# Patient Record
Sex: Male | Born: 1980 | Race: Black or African American | Hispanic: No | Marital: Single | Smoking: Current every day smoker
Health system: Southern US, Community
[De-identification: ages and names within clinical notes are randomized; demographics above are authoritative.]

---

## 1999-12-02 ENCOUNTER — Emergency Department (HOSPITAL_COMMUNITY): Admission: EM | Admit: 1999-12-02 | Discharge: 1999-12-02 | Payer: Self-pay | Admitting: Emergency Medicine

## 1999-12-03 ENCOUNTER — Emergency Department (HOSPITAL_COMMUNITY): Admission: EM | Admit: 1999-12-03 | Discharge: 1999-12-03 | Payer: Self-pay

## 2004-10-05 ENCOUNTER — Emergency Department (HOSPITAL_COMMUNITY): Admission: EM | Admit: 2004-10-05 | Discharge: 2004-10-05 | Payer: Self-pay | Admitting: Family Medicine

## 2006-06-21 ENCOUNTER — Other Ambulatory Visit: Payer: Self-pay

## 2006-06-21 ENCOUNTER — Emergency Department: Payer: Self-pay | Admitting: Emergency Medicine

## 2007-12-08 IMAGING — CR DG CHEST 2V
1 series · 2 of 2 positions shown · non-contrast
Comparison: none

REASON FOR EXAM: Syncope
COMMENTS:

PROCEDURE:     DXR - DXR CHEST PA (OR AP) AND LATERAL  - June 21, 2006  [DATE]
RESULT:     The lungs are clear.  The cardiac silhouette and visualized bony
skeleton are unremarkable.

[Series 1: view not recorded · 0.17mm/px · 2 of 2 slices shown]
[im 1/2]
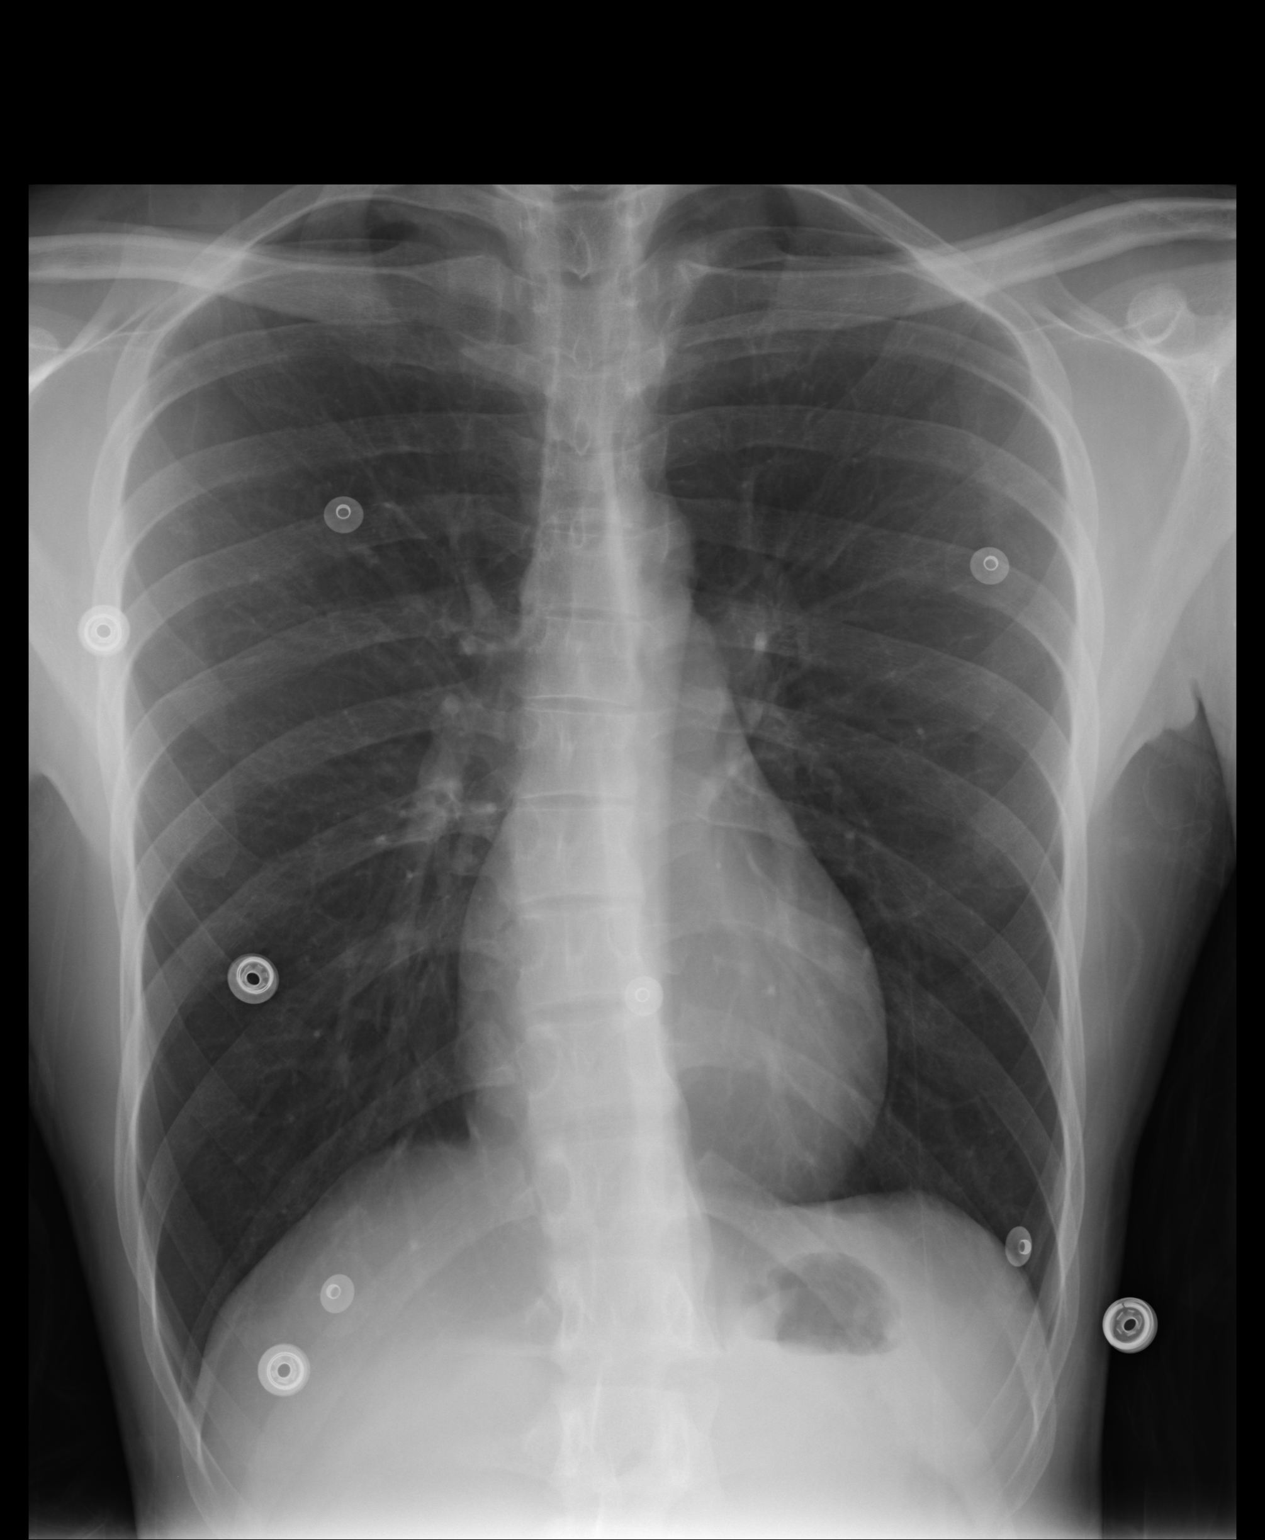
[im 2/2]
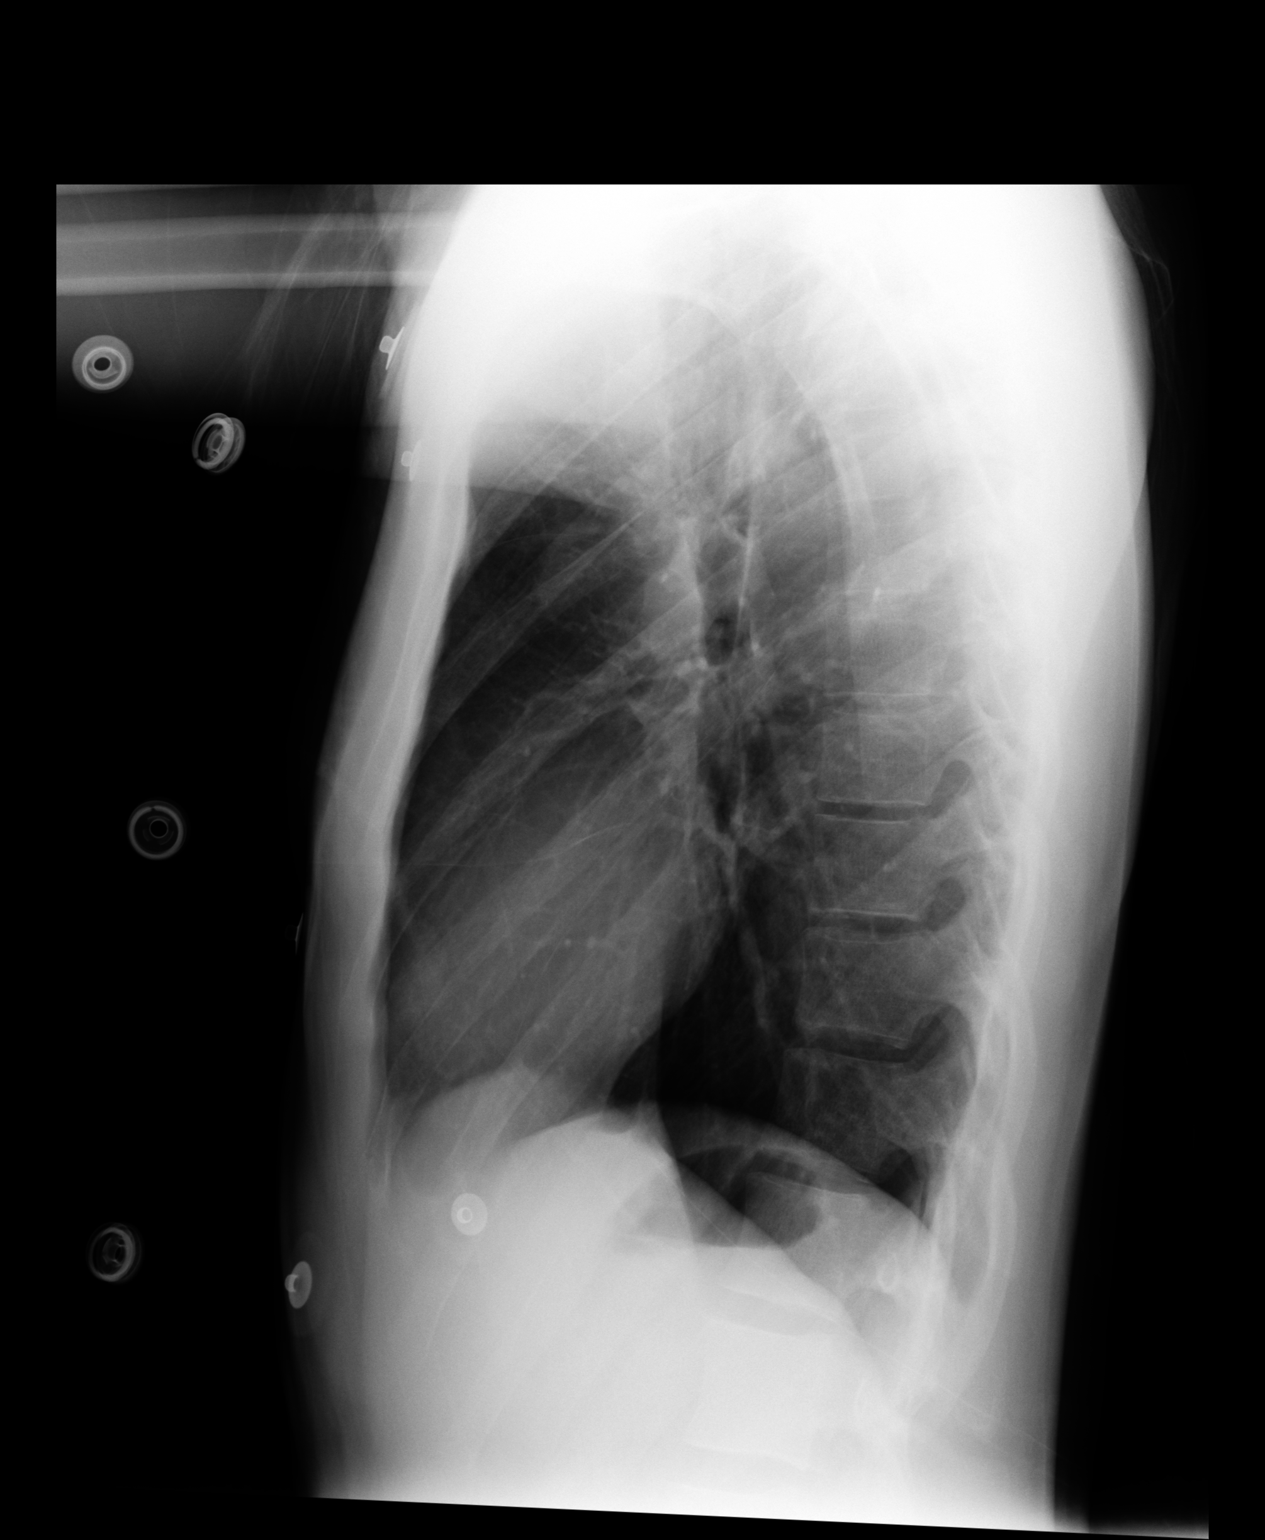

[2 of 2 positions shown; findings below may reference images not displayed]

IMPRESSION: Chest radiograph without evidence of acute cardiopulmonary disease.

## 2007-12-08 IMAGING — CT CT HEAD WITHOUT CONTRAST
2 series · 16 of 30 positions shown, 20 images · non-contrast
Comparison: none

REASON FOR EXAM: Syncope
COMMENTS:

[Series 2: without · axial · non-contrast · 0.40mm/px · z∈[+609,+734]mm · 13 of 31 slices shown, 17 images]
[im 3/31  brain]
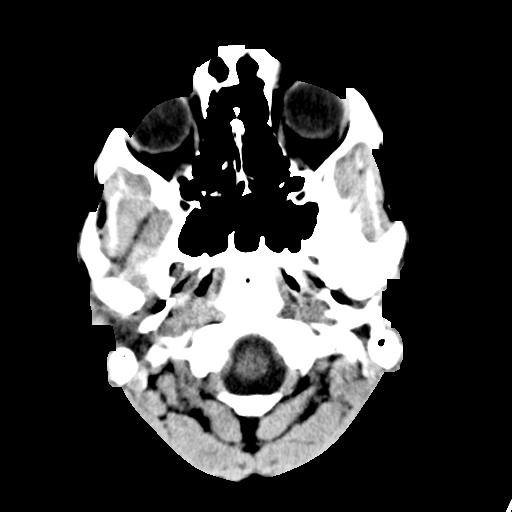
[im 3/31  bone]
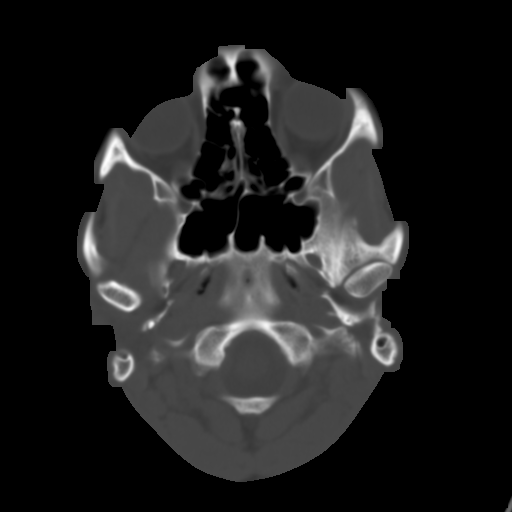
[im 5/31  brain]
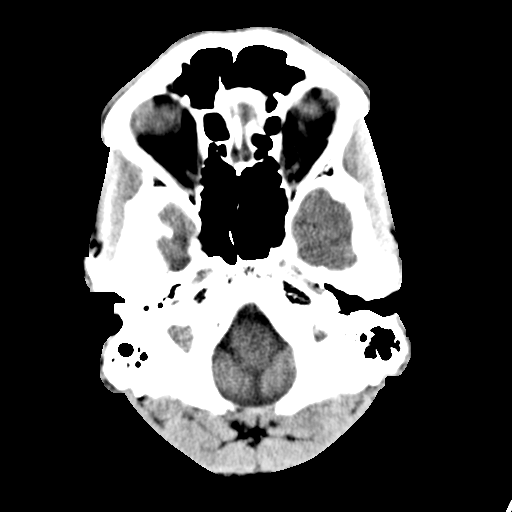
[im 7/31  brain]
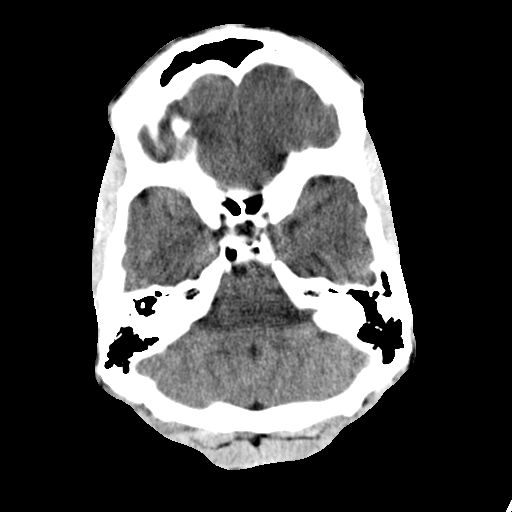
[im 9/31  brain]
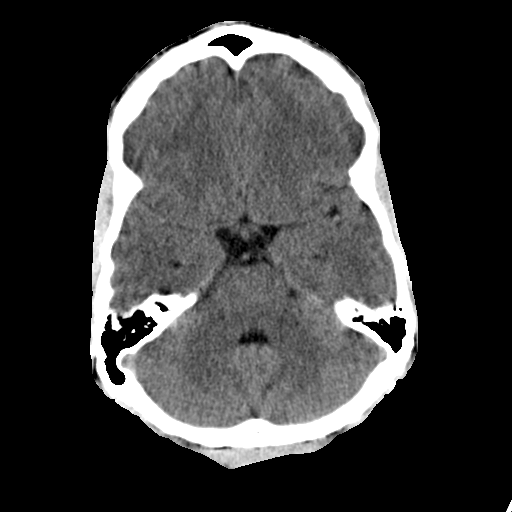
[im 11/31  brain]
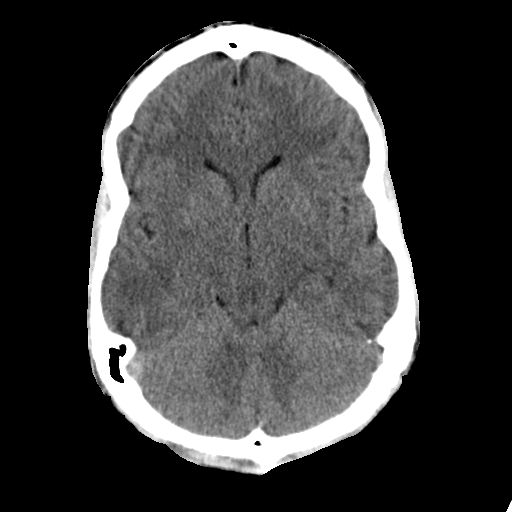
[im 11/31  bone]
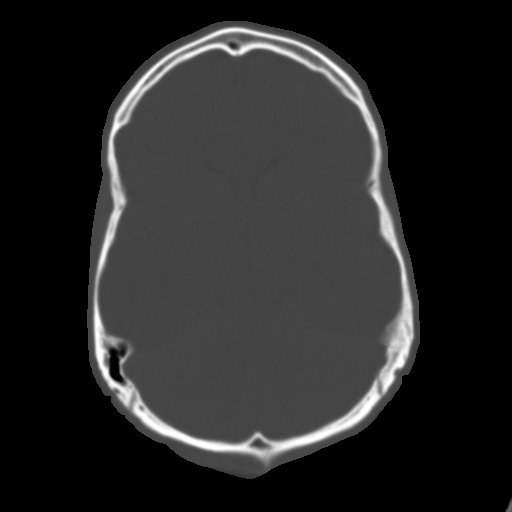
[im 13/31  brain]
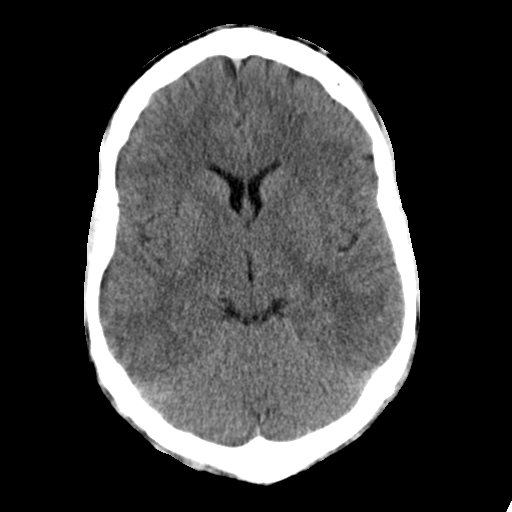
[im 16/31  brain]
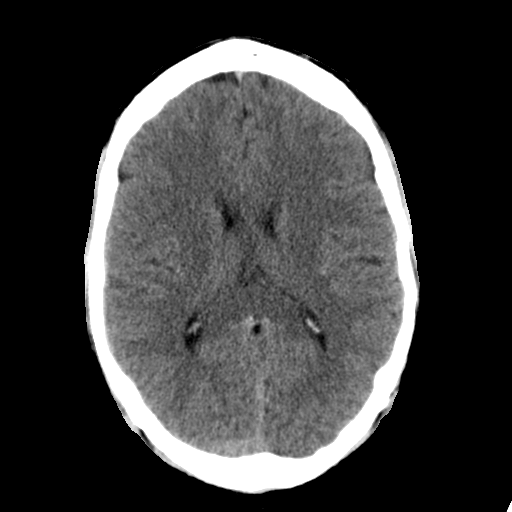
[im 18/31  brain]
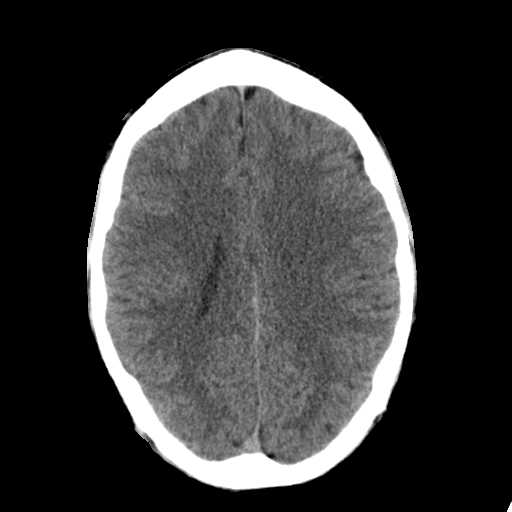
[im 20/31  brain]
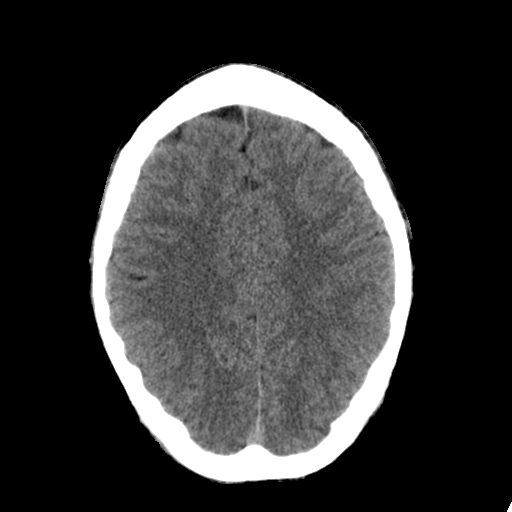
[im 20/31  bone]
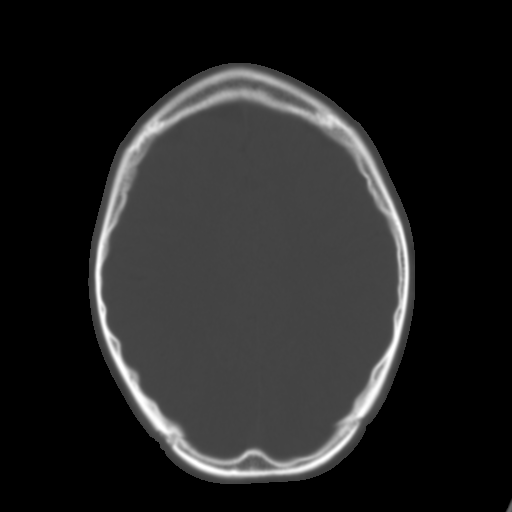
[im 22/31  brain]
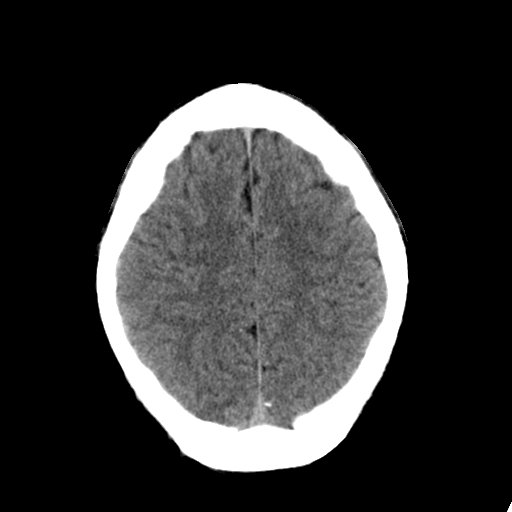
[im 24/31  brain]
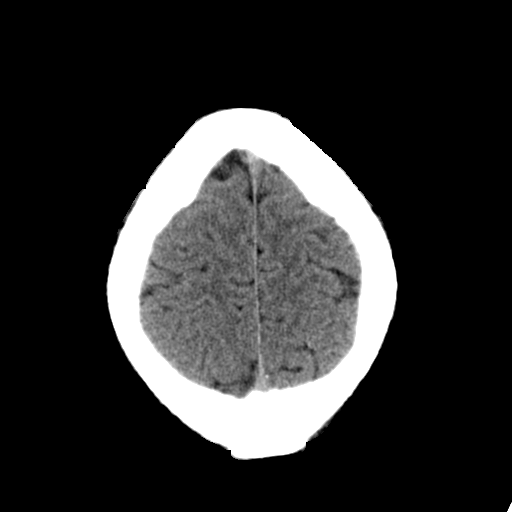
[im 26/31  brain]
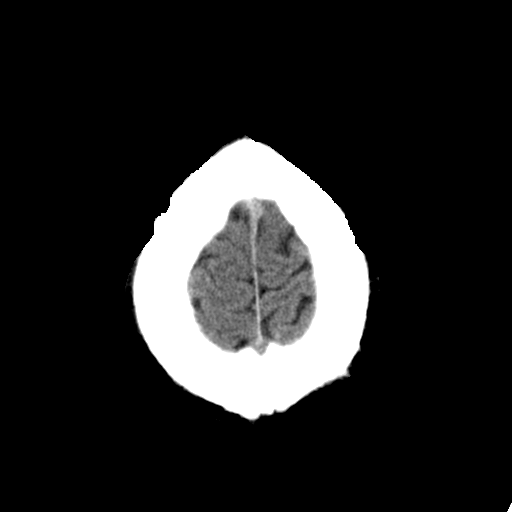
[im 28/31  brain]
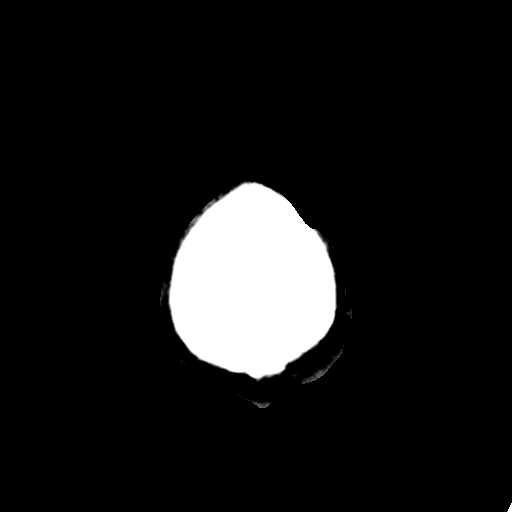
[im 28/31  bone]
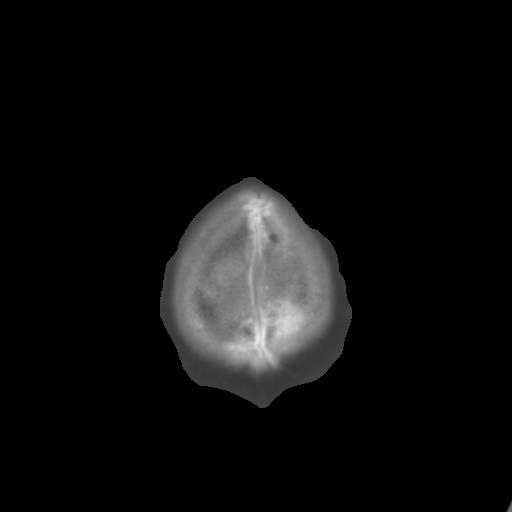

[Series 3: bone · axial · 0.40mm/px · z∈[+609,+649]mm · 3 of 31 slices shown]
[im 3/31  bone]
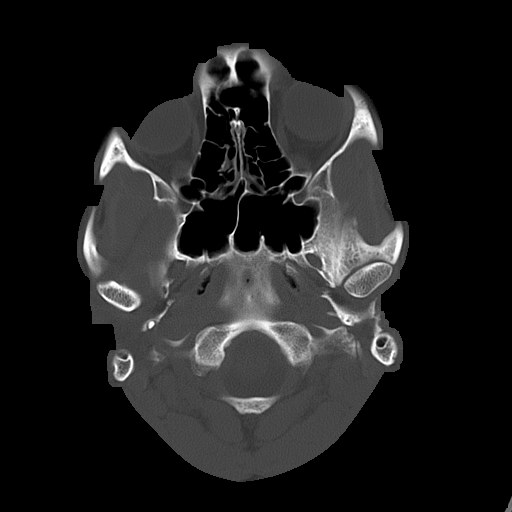
[im 7/31  bone]
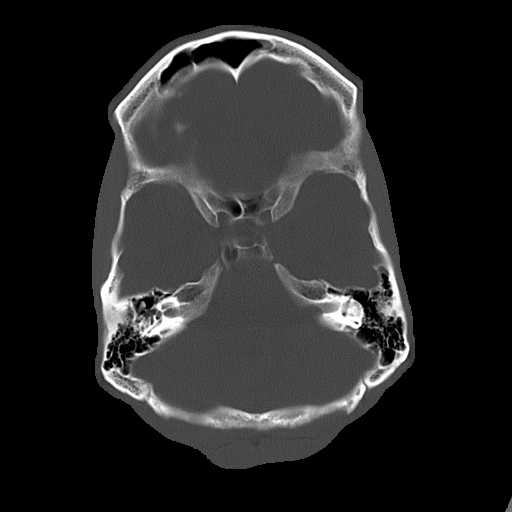
[im 11/31  bone]
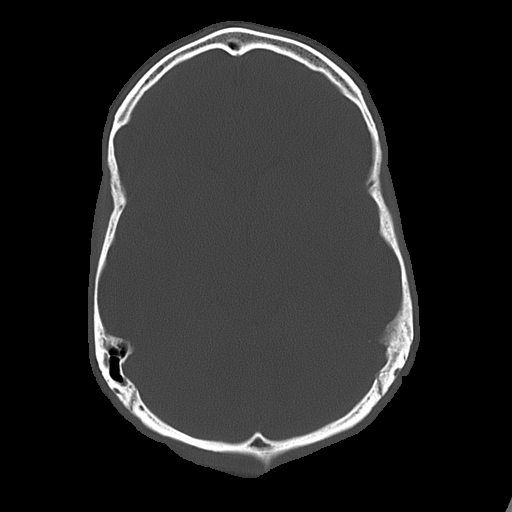

[16 of 30 positions shown; findings below may reference images not displayed]

PROCEDURE:     CT  - CT HEAD WITHOUT CONTRAST  - June 21, 2006  [DATE]

RESULT:     There is no evidence of intra-axial or extra-axial fluid
collections or evidence of acute hemorrhage. No secondary signs are
appreciated to suggest mass effect or subacute or chronic infarction.

The visualized bony skeleton evaluated with bone windowing demonstrates no
evidence of fracture or dislocation.
IMPRESSION: 1.     Unremarkable head CT as described above.
2.     Dr. Khalsa of the Emergency Department was informed of these
findings at the time of the initial interpretation.

## 2015-11-05 ENCOUNTER — Encounter (HOSPITAL_COMMUNITY): Payer: Self-pay

## 2015-11-05 ENCOUNTER — Emergency Department (HOSPITAL_COMMUNITY)
Admission: EM | Admit: 2015-11-05 | Discharge: 2015-11-05 | Disposition: A | Discharge status: Home / Self Care | Payer: Self-pay | Attending: Emergency Medicine | Admitting: Emergency Medicine

## 2015-11-05 DIAGNOSIS — K0889 Other specified disorders of teeth and supporting structures: Secondary | ICD-10-CM | POA: Insufficient documentation

## 2015-11-05 DIAGNOSIS — R6883 Chills (without fever): Secondary | ICD-10-CM | POA: Insufficient documentation

## 2015-11-05 DIAGNOSIS — R51 Headache: Secondary | ICD-10-CM | POA: Insufficient documentation

## 2015-11-05 DIAGNOSIS — K0381 Cracked tooth: Secondary | ICD-10-CM | POA: Insufficient documentation

## 2015-11-05 MED ORDER — IBUPROFEN 400 MG PO TABS
800.0000 mg | ORAL_TABLET | Freq: Once | ORAL | Status: AC
  Administered 2015-11-05: 800 mg via ORAL
  Filled 2015-11-05: qty 2

## 2015-11-05 MED ORDER — BUPIVACAINE-EPINEPHRINE (PF) 0.5% -1:200000 IJ SOLN
1.8000 mL | Freq: Once | INTRAMUSCULAR | Status: AC
  Administered 2015-11-05: 1.8 mL
  Filled 2015-11-05: qty 1.8

## 2015-11-05 MED ORDER — PENICILLIN V POTASSIUM 500 MG PO TABS: 500.0000 mg | ORAL_TABLET | Freq: Four times a day (QID) | ORAL | Status: AC

## 2015-11-05 MED ORDER — PENICILLIN V POTASSIUM 250 MG PO TABS
500.0000 mg | ORAL_TABLET | Freq: Once | ORAL | Status: AC
  Administered 2015-11-05: 500 mg via ORAL
  Filled 2015-11-05: qty 2

## 2015-11-05 NOTE — Discharge Instructions (Signed)
Mr. Garrett Olson,  Nice meeting you! Please follow-up with your dentist. Return to the emergency department if you develop fevers, chills, increased pain, difficulty opening/closing your jaw, difficulty swallowing. Feel better soon!  S. Garrett HackerNicole Caylyn Tedeschi, PA-C  [employer] [facility] The [employer] 211 is a great source of information about community services available.  Access by dialing 2-1-1 from anywhere in [place], or by website -  PooledIncome.plwww.nc211.org.   Other Local Resources (Updated 06/2015)  Dental  Care   Services    Phone Number and Address  Cost  [facility] For children 850 - 37 years of age:   Cleaning  Tooth brushing/flossing instruction  Sealants, fillings, crowns  Extractions  Emergency treatment  [phone] [address] [place], [place] [address] Charges based on family income.  Medicaid and some insurance plans accepted.     [facility]  Cleaning  Sealants, fillings, crowns  Extractions  Emergency treatment [phone] [address] [place], [place]  Pregnant women 37 years of age or older with a Medicaid card  [facility] [place]  Cleaning  Sealants, fillings, crowns  Extractions  Emergency treatment [phone] [address] [place], [place] Pregnant women 37 years of age or older with a Medicaid card  [facility] For children 360 - 37 years of age:   Cleaning  Tooth brushing/flossing instruction  Sealants, fillings, crowns  Extractions  Emergency treatment Limited orthodontic services for patients with Medicaid [phone] [address] [place], [place] [address] Medicaid and [facility] Health Choice cover for children up to age 37 and pregnant women.  Parents of children up to age 37 without Medicaid pay a reduced fee at time of service.  [facility] [place] For children 200 - 37 years of age:   Cleaning  Tooth brushing/flossing instruction  Sealants, fillings, crowns  Extractions  Emergency treatment Limited orthodontic services for patients with Medicaid [phone] [address] [place], [place].  Medicaid and [place] Health Choice cover for children up to age 37 and pregnant women.  Parents of children up to age 37 without Medicaid pay a reduced fee.  [facility]  Cleaning  Sealants, fillings, crowns  Extractions  Hours: Tuesdays and Thursdays, 4:15 - 8 pm [phone] [address] [place], [place] [address] Services free of charge to [facility] residents ages 18-64 who do not have health insurance, Medicare, [place], or [place] benefits and fall within federal poverty guidelines  [facility]    Provides dental care in addition to primary medical care, nutritional counseling, and pharmacy:  Nurse, mental healthCleaning  Sealants, fillings, crowns  Extractions                  [phone] [facility], [address] [place], [place]  [phone] [facility], [address] [place], [place]  [phone] [facility] [place], [place]  [phone] [facility], [address] [place], [place]  [phone] [facility] [address] [place], [place] Accepts [place], [place], most insurance.  Also provides services available to all with fees adjusted based on ability to pay.    [facility]  Cleaning  Tooth brushing/flossing instruction  Sealants, fillings, crowns  Extractions  Emergency treatment  Hours: Tuesdays, Thursdays, and Fridays from 8 am to 5 pm by appointment only. [phone] [address] [place], [place] [address] [facility] residents with  Medicaid (depending on eligibility) and children with [facility] Health Choice - call for more information.  [facility]  Extractions only  Hours: 2nd and 4th Thursday of each month from 6:30 am - 9 am.   [phone] [address] [place], [place] [address] Ages 67 and older only.  Patients are seen on a first come, first served basis.  [employer]  [employer]  Extractions  Orthodontics  Endodontics  Implants/Crowns/Bridges  Complete and partial dentures [phone] [place], [place] Patients must complete an application for services.  There is often a waiting list.

## 2015-11-05 NOTE — ED Provider Notes (Signed)
CSN: [id number]     Arrival date & time 11/05/15  1717 History  By signing my name below, I, Garrett Olson, attest that this documentation has been prepared under the direction and in the presence of Melton Krebs, PA-C. Electronically Signed: [name], ED Scribe. 11/05/2015. 5:26 PM.    Chief Complaint  Patient presents with  . Dental Pain   Patient is a 35 y.o. male presenting with tooth pain. The history is provided by the patient. No language interpreter was used.  Dental Pain Associated symptoms: headaches   Associated symptoms: no fever    HPI Comments: Garrett Olson is a 35 y.o. male who presents to the Emergency Department complaining of left sided dental pain onset 1 week prior. Pt states that he has associated chills and HA. Pt states that he feels as if he has a wisdom tooth growing in. Pt states that he has been taking tylenol with no relief. Pt denies fever, nausea, vomiting, or trouble swallowing.   No past medical history on file. No past surgical history on file. No family history on file. Social History  Substance Use Topics  . Smoking status: Not on file  . Smokeless tobacco: Not on file  . Alcohol Use: Not on file    Review of Systems  Constitutional: Positive for chills. Negative for fever.  HENT: Positive for dental problem. Negative for trouble swallowing.   Gastrointestinal: Negative for nausea and vomiting.  Neurological: Positive for headaches.  A complete 10 system review of systems was obtained and all systems are negative except as noted in the HPI and PMH.     Allergies  Review of patient's allergies indicates not on file.  Home Medications   Prior to Admission medications   Not on File   BP 131/74 mmHg  Pulse 73  Temp(Src) 98.7 F (37.1 C) (Oral)  SpO2 99%   Physical Exam  Constitutional: He is oriented to person, place, and time. He appears well-developed and well-nourished. No distress.  HENT:  Head: Normocephalic  and atraumatic.  Mouth/Throat:    Broken posterior left molar no erythema or fluctuance. No trismus.  Eyes: Conjunctivae and EOM are normal.  Neck: Neck supple. No tracheal deviation present.  Cardiovascular: Normal rate.   Pulmonary/Chest: Effort normal. No respiratory distress.  Musculoskeletal: Normal range of motion.  Neurological: He is alert and oriented to person, place, and time.  Skin: Skin is warm and dry.  Psychiatric: He has a normal mood and affect. His behavior is normal.  Nursing note and vitals reviewed.   ED Course  .Nerve Block Date/Time: 11/05/2015 5:39 PM Performed by: Althea Grimmer NICOLE Authorized by: Melton Krebs Consent: Verbal consent obtained. Risks and benefits: risks, benefits and alternatives were discussed Consent given by: patient Patient identity confirmed: verbally with patient and arm band Indications: pain relief Body area: face/mouth Nerve: inferior alveolar Local anesthetic: bupivacaine 0.5% with epinephrine Outcome: pain improved Patient tolerance: Patient tolerated the procedure well with no immediate complications    DIAGNOSTIC STUDIES: Oxygen Saturation is 99% on RA, normal by my interpretation.    COORDINATION OF CARE: 5:27 PM-Discussed treatment plan with pt at bedside and pt agreed to plan.    MDM   Final diagnoses:  Dentalgia   Patient with toothache.  No gross abscess.  Exam unconcerning for Ludwig's angina or spread of infection.  Will treat with penicillin and ibuprofen.  Urged patient to follow-up with dentist.    Patient may be safely discharged home.  Discussed reasons for return. Patient to follow-up with dentist. Provided resources. Patient in understanding and agreement with the plan.   Melton KrebsSamantha Nicole Ramsey Midgett, PA-C 11/05/15 1740  Raeford RazorStephen Kohut, MD 11/14/15 [phone]

## 2015-11-05 NOTE — ED Notes (Signed)
Pt stable, ambulatory, states understanding of discharge instructions 

## 2015-11-05 NOTE — ED Notes (Signed)
Pt c/o lower left dental pain

## 2015-11-11 ENCOUNTER — Emergency Department (HOSPITAL_COMMUNITY)
Admission: EM | Admit: 2015-11-11 | Discharge: 2015-11-11 | Disposition: A | Discharge status: Home / Self Care | Payer: Self-pay | Attending: Emergency Medicine | Admitting: Emergency Medicine

## 2015-11-11 ENCOUNTER — Encounter (HOSPITAL_COMMUNITY): Payer: Self-pay

## 2015-11-11 ENCOUNTER — Encounter (HOSPITAL_COMMUNITY): Payer: Self-pay | Admitting: *Deleted

## 2015-11-11 DIAGNOSIS — K611 Rectal abscess: Secondary | ICD-10-CM | POA: Insufficient documentation

## 2015-11-11 DIAGNOSIS — L0231 Cutaneous abscess of buttock: Secondary | ICD-10-CM | POA: Insufficient documentation

## 2015-11-11 MED ORDER — AMOXICILLIN-POT CLAVULANATE 875-125 MG PO TABS
1.0000 | ORAL_TABLET | Freq: Once | ORAL | Status: AC
  Administered 2015-11-11: 1 via ORAL
  Filled 2015-11-11: qty 1

## 2015-11-11 MED ORDER — FENTANYL CITRATE (PF) 100 MCG/2ML IJ SOLN
50.0000 ug | Freq: Once | INTRAMUSCULAR | Status: AC
  Administered 2015-11-11: 50 ug via INTRAVENOUS
  Filled 2015-11-11: qty 2

## 2015-11-11 MED ORDER — OXYCODONE-ACETAMINOPHEN 5-325 MG PO TABS
2.0000 | ORAL_TABLET | Freq: Once | ORAL | Status: AC
  Administered 2015-11-11: 2 via ORAL
  Filled 2015-11-11: qty 2

## 2015-11-11 MED ORDER — LIDOCAINE-EPINEPHRINE (PF) 2 %-1:200000 IJ SOLN
20.0000 mL | Freq: Once | INTRAMUSCULAR | Status: AC
  Administered 2015-11-11: 20 mL via INTRADERMAL
  Filled 2015-11-11: qty 20

## 2015-11-11 MED ORDER — AMOXICILLIN-POT CLAVULANATE 875-125 MG PO TABS: 1.0000 | ORAL_TABLET | Freq: Two times a day (BID) | ORAL | Status: AC

## 2015-11-11 MED ORDER — OXYCODONE-ACETAMINOPHEN 5-325 MG PO TABS: 1.0000 | ORAL_TABLET | Freq: Four times a day (QID) | ORAL | Status: DC | PRN

## 2015-11-11 NOTE — Discharge Instructions (Signed)
Take antibiotics as prescribed.  Please return to the ER in 2 days for wound recheck  Perirectal Abscess An abscess is an infected area that contains a collection of pus. A perirectal abscess is an abscess that is near the opening of the anus or around the rectum. A perirectal abscess can cause a lot of pain, especially during bowel movements. CAUSES This condition is almost always caused by an infection that starts in an anal gland. RISK FACTORS This condition is more likely to develop in:  People with diabetes or inflammatory bowel disease.  People whose body defense system (immune system) is weak.  People who have anal sex.  People who have a sexually transmitted disease (STD).  People who have certain kinds of cancers, such as rectal carcinoma, leukemia, or lymphoma. SYMPTOMS The main symptom of this condition is pain. The pain may be a throbbing pain that gets worse during bowel movements. Other symptoms include:  Fever.  Swelling.  Redness.  Bleeding.  Constipation. DIAGNOSIS The condition is diagnosed with a physical exam. If the abscess is not visible, a health care provider may need to place a finger inside the rectum to find the abscess. Sometimes, imaging tests are done to determine the size and location of the abscess. These tests may include:  An ultrasound.  An MRI.  A CT scan. TREATMENT This condition is usually treated with incision and drainage surgery. Incision and drainage surgery involves making an incision over the abscess to drain the pus. Treatment may also involve antibiotic medicine, pain medicine, stool softeners, or laxatives. HOME CARE INSTRUCTIONS  Take medicines only as directed by your health care provider.  If you were prescribed an antibiotic, finish all of it even if you start to feel better.  To relieve pain, try sitting:  In a warm, shallow bath (sitz bath).  On a heating pad with the setting on low.  On an inflatable  donut-shaped cushion.  Follow any diet instructions as directed by your health care provider.  Keep all follow-up visits as directed by your health care provider. This is important. SEEK MEDICAL CARE IF:  Your abscess is bleeding.  You have pain, swelling, or redness that is getting worse.  You are constipated.  You feel ill.  You have muscle aches or chills.  You have a fever.  Your symptoms return after the abscess has healed.   This information is not intended to replace advice given to you by your health care provider. Make sure you discuss any questions you have with your health care provider.   Document Released: 06/04/2000 Document Revised: 02/26/2015 Document Reviewed: 04/17/2014 Elsevier Interactive Patient Education 2016 Elsevier Inc. Incision and Drainage Incision and drainage is a procedure in which a sac-like structure (cystic structure) is opened and drained. The area to be drained usually contains material such as pus, fluid, or blood.  LET YOUR CAREGIVER KNOW ABOUT:   Allergies to medicine.  Medicines taken, including vitamins, herbs, eyedrops, over-the-counter medicines, and creams.  Use of steroids (by mouth or creams).  Previous problems with anesthetics or numbing medicines.  History of bleeding problems or blood clots.  Previous surgery.  Other health problems, including diabetes and kidney problems.  Possibility of pregnancy, if this applies. RISKS AND COMPLICATIONS  Pain.  Bleeding.  Scarring.  Infection. BEFORE THE PROCEDURE  You may need to have an ultrasound or other imaging tests to see how large or deep your cystic structure is. Blood tests may also be used to determine  if you have an infection or how severe the infection is. You may need to have a tetanus shot. PROCEDURE  The affected area is cleaned with a cleaning fluid. The cyst area will then be numbed with a medicine (local anesthetic). A small incision will be made in the  cystic structure. A syringe or catheter may be used to drain the contents of the cystic structure, or the contents may be squeezed out. The area will then be flushed with a cleansing solution. After cleansing the area, it is often gently packed with a gauze or another wound dressing. Once it is packed, it will be covered with gauze and tape or some other type of wound dressing. AFTER THE PROCEDURE   Often, you will be allowed to go home right after the procedure.  You may be given antibiotic medicine to prevent or heal an infection.  If the area was packed with gauze or some other wound dressing, you will likely need to come back in 1 to 2 days to get it removed.  The area should heal in about 14 days.   This information is not intended to replace advice given to you by your health care provider. Make sure you discuss any questions you have with your health care provider.   Document Released: 12/01/2000 Document Revised: 12/07/2011 Document Reviewed: 08/02/2011 Elsevier Interactive Patient Education 2016 Elsevier Inc.  Incision and Drainage, Care After Refer to this sheet in the next few weeks. These instructions provide you with information on caring for yourself after your procedure. Your caregiver may also give you more specific instructions. Your treatment has been planned according to current medical practices, but problems sometimes occur. Call your caregiver if you have any problems or questions after your procedure. HOME CARE INSTRUCTIONS   If antibiotic medicine is given, take it as directed. Finish it even if you start to feel better.  Only take over-the-counter or prescription medicines for pain, discomfort, or fever as directed by your caregiver.  Keep all follow-up appointments as directed by your caregiver.  Change any bandages (dressings) as directed by your caregiver. Replace old dressings with clean dressings.  Wash your hands before and after caring for your  wound. You will receive specific instructions for cleansing and caring for your wound.  SEEK MEDICAL CARE IF:   You have increased pain, swelling, or redness around the wound.  You have increased drainage, smell, or bleeding from the wound.  You have muscle aches, chills, or you feel generally sick.  You have a fever. MAKE SURE YOU:   Understand these instructions.  Will watch your condition.  Will get help right away if you are not doing well or get worse.   This information is not intended to replace advice given to you by your health care provider. Make sure you discuss any questions you have with your health care provider.   Document Released: 08/30/2011 Document Revised: 06/28/2014 Document Reviewed: 08/30/2011 Elsevier Interactive Patient Education [employer]

## 2015-11-11 NOTE — ED Notes (Signed)
Pt is in stable condition upon d/c and ambulates from ED. 

## 2015-11-11 NOTE — ED Notes (Signed)
Patient here with abscess to buttocks x 5 days. Reports draining x 3. No hx of same

## 2015-11-11 NOTE — ED Notes (Signed)
The pt is c/o an abscess on hisbuttocks for one week  No previous history

## 2015-11-11 NOTE — ED Notes (Signed)
I&D tray set up at bedside.

## 2015-11-11 NOTE — ED Provider Notes (Addendum)
CSN: [id number]     Arrival date & time 11/11/15  0902 History  By signing my name below, I, Ronney Lion, attest that this documentation has been prepared under the direction and in the presence of Danelle Berry, PA-C. Electronically Signed: Ronney Lion, ED Scribe. 11/11/2015. 2:40 PM.    Chief Complaint  Patient presents with  . Abscess   The history is provided by the patient and the spouse. No language interpreter was used.   HPI Comments: Garrett Olson is a 35 y.o. male who presents to the Emergency Department complaining of a new, gradual-onset, constant, gradually worsening, localized area of pain, redness, and swelling on his left buttock that began 5 days ago. His wife states the area had come to a head 2 days after onset and began drained spontaneously. She reports she had applied warm compresses to the area for the past 5 days, it continue to have purulent drainage but the size and pain have not improved.  Currently pain is rated 5/10 without radiation, worsened with attempting to sit, or with palpation, no alleviating factors. He denies fever, chills, sweats, N, V, abdominal pain.  No difficulty with BM.  Patient reports a history of asthma but otherwise denies any other chronic medical conditions. He denies a history of DM.  Patient has NKDA to antibiotics.   History reviewed. No pertinent past medical history. History reviewed. No pertinent past surgical history. No family history on file. Social History  Substance Use Topics  . Smoking status: Never Smoker   . Smokeless tobacco: None  . Alcohol Use: Yes    Review of Systems  Constitutional: Negative for fever.  Gastrointestinal: Negative.  Negative for nausea, vomiting and abdominal pain.  Genitourinary: Negative.   Skin: Positive for color change.       Positive for localized area of pain, redness, and swelling.  All other systems reviewed and are negative.   Allergies  Review of patient's allergies indicates no known  allergies.  Home Medications   Prior to Admission medications   Medication Sig Start Date End Date Taking? Authorizing Provider  penicillin v potassium (VEETID) 500 MG tablet Take 1 tablet (500 mg total) by mouth 4 (four) times daily. 11/05/15 11/12/15  Melton Krebs, PA-C   BP 125/84 mmHg  Pulse 83  Temp(Src) 98.1 F (36.7 C) (Oral)  Resp 18  SpO2 99% Physical Exam  Constitutional: He is oriented to person, place, and time. He appears well-developed and well-nourished. No distress.  Non-toxic appearing male, thin, appears extremely uncomfortable  HENT:  Head: Normocephalic and atraumatic.  Right Ear: External ear normal.  Left Ear: External ear normal.  Nose: Nose normal.  Eyes: Conjunctivae and EOM are normal. Pupils are equal, round, and reactive to light. Right eye exhibits no discharge. Left eye exhibits no discharge. No scleral icterus.  Neck: Normal range of motion. Neck supple. No JVD present. No tracheal deviation present. No thyromegaly present.  Cardiovascular: Normal rate and regular rhythm.   Pulmonary/Chest: Effort normal and breath sounds normal. No respiratory distress.  Abdominal: Soft. Bowel sounds are normal. He exhibits no distension. There is no tenderness.  Genitourinary: Rectum normal.  Musculoskeletal: Normal range of motion.  Lymphadenopathy:    He has no cervical adenopathy.  Neurological: He is alert and oriented to person, place, and time. He exhibits normal muscle tone. Coordination normal.  Skin: Skin is warm and dry. No rash noted. He is not diaphoretic. There is erythema. No pallor.  Left buttock -  large, approx 10 cm x 10 cm area of swelling, with circumferential induration and erythema with central 2 cm area of fluctuance with multiple openings with purulent white to yellow drainage.  No induration adjacent to anus, rectum normal  Psychiatric: He has a normal mood and affect. His behavior is normal. Judgment and thought content normal.   Nursing note and vitals reviewed.   ED Course  Procedures (including critical care time)  DIAGNOSTIC STUDIES: Oxygen Saturation is 99% on RA, normal by my interpretation.    COORDINATION OF CARE: 11:18 AM - Discussed treatment plan with pt at bedside which includes I&D. Pt verbalized understanding and agreed to plan.   INCISION AND DRAINAGE PROCEDURE NOTE: Patient identification was confirmed and verbal consent was obtained. This procedure was performed by Danelle BerryLeisa Chessa Barrasso, PA-C, at 11:23 AM. Site: Left buttock Sterile procedures observed Needle size: 27 Anesthetic used (type and amt): lidocaine 2% w/ epi, 4 mL Blade size: 11 Drainage: copious, bloody, purulent Complexity: Complex Packing used Site anesthetized, incision made over site, wound drained and explored loculations, rinsed with copious amounts of normal saline, wound packed with sterile gauze, covered with dry, sterile dressing.  Pt tolerated procedure well without complications.  Instructions for care discussed verbally and pt provided with additional written instructions for homecare and f/u.   MDM   Final diagnoses:  Abscess, perirectal   Patient with skin abscess amenable to incision and drainage.  Abscess was large enough to warrant packing; wound recheck in 2 days to remove packing.  Pt and wife educated about wound care with packing.  Mild signs of cellulitis is surrounding skin.  Will d/c to home with Augmentin and Percocet for pain.  Pt verbalized understanding and agreed to plan.    I personally performed the services described in this documentation, which was scribed in my presence. The recorded information has been reviewed and is accurate.       Danelle BerryLeisa Kittie Krizan, PA-C 11/12/15 1203  Arby BarretteMarcy Pfeiffer, MD 11/13/15 1655   EMERGENCY DEPARTMENT US SOFT TISSUE INTERPRETATION "Study: Limited Ultrasound of the noted body part in comments below"  INDICATIONS: Pain and Soft tissue infection Multiple views of  the body part are obtained with a multi-frequency linear probe  PERFORMED BY:  Myself  IMAGES ARCHIVED?: Yes  SIDE:Left and Midline  BODY PART:Other soft tisse (comment in note)  FINDINGS: Abcess present and Cellulitis present  LIMITATIONS:  Emergent Procedure  INTERPRETATION:  Abcess present and Cellulitis present  COMMENT:  Left buttock large indurated area with central fluctuance and drainage    Danelle BerryLeisa Reine Bristow, PA-C 11/22/15 2329  Arby BarretteMarcy Pfeiffer, MD 12/07/15 2333

## 2015-11-11 NOTE — ED Notes (Signed)
Pts name called for a room no answer 

## 2015-11-12 ENCOUNTER — Encounter (HOSPITAL_COMMUNITY): Payer: Self-pay | Admitting: Family Medicine

## 2015-11-12 ENCOUNTER — Emergency Department (HOSPITAL_COMMUNITY)
Admission: EM | Admit: 2015-11-12 | Discharge: 2015-11-12 | Disposition: A | Discharge status: Home / Self Care | Payer: MEDICAID | Attending: Emergency Medicine | Admitting: Emergency Medicine

## 2015-11-12 DIAGNOSIS — Z4801 Encounter for change or removal of surgical wound dressing: Secondary | ICD-10-CM | POA: Insufficient documentation

## 2015-11-12 DIAGNOSIS — Z5189 Encounter for other specified aftercare: Secondary | ICD-10-CM

## 2015-11-12 NOTE — Discharge Instructions (Signed)

## 2015-11-12 NOTE — ED Notes (Signed)
Pt here for wound recheck on buttocks. sts was seen here yesterday and had it lanced and packed.

## 2015-11-12 NOTE — ED Provider Notes (Signed)
CSN: [id number]     Arrival date & time 11/12/15  1305 History  By signing my name below, I, Essence Howell, attest that this documentation has been prepared under the direction and in the presence of Roxy Horseman, PA-C. Electronically Signed: Charline Bills, ED Scribe 11/12/2015 at 2:28 PM.    Chief Complaint  Patient presents with  . Wound Check   The history is provided by the patient. No language interpreter was used.   HPI Comments: Garrett Olson is a 35 y.o. male who presents to the Emergency Department with a chief complaint of a follow-up for abscess on left buttock that was drained and packed yesterday. Pt states that he removed his packing himself. Pt states he has been taking his prescribed Augmentin. Pt denies fever and any other complaints.   States that it has been feeling better.  History reviewed. No pertinent past medical history. History reviewed. No pertinent past surgical history. History reviewed. No pertinent family history. Social History  Substance Use Topics  . Smoking status: Never Smoker   . Smokeless tobacco: None  . Alcohol Use: Yes    Review of Systems  Constitutional: Negative for fever.  Skin: Positive for wound.      Allergies  Review of patient's allergies indicates no known allergies.  Home Medications   Prior to Admission medications   Medication Sig Start Date End Date Taking? Authorizing Provider  amoxicillin-clavulanate (AUGMENTIN) 875-125 MG tablet Take 1 tablet by mouth every 12 (twelve) hours. 11/11/15   Danelle Berry, PA-C  oxyCODONE-acetaminophen (PERCOCET/ROXICET) 5-325 MG tablet Take 1-2 tablets by mouth every 6 (six) hours as needed for severe pain. 11/11/15   Danelle Berry, PA-C  penicillin v potassium (VEETID) 500 MG tablet Take 1 tablet (500 mg total) by mouth 4 (four) times daily. 11/05/15 11/12/15  Melton Krebs, PA-C   BP 119/85 mmHg  Pulse 94  Temp(Src) 98.4 F (36.9 C) (Oral)  Resp 18  SpO2 95% Physical  Exam Physical Exam  Constitutional: Pt is oriented to person, place, and time. Pt appears well-developed and well-nourished. No distress.  HENT:  Head: Normocephalic and atraumatic.  Eyes: Conjunctivae are normal. No scleral icterus.  Neck: Normal range of motion.  Cardiovascular: Normal rate, regular rhythm and intact distal pulses.   Pulmonary/Chest: Effort normal and breath sounds normal.  Abdominal: Soft. Pt exhibits no distension. There is no tenderness.  Lymphadenopathy:    Pt has no cervical adenopathy.  Neurological: Pt is alert and oriented to person, place, and time.  Skin: Skin is warm and dry. Pt is not diaphoretic. Open, draining abscess to left buttock, no evidence of cellulitis or recurrent accumulation  Psychiatric: Pt has a normal mood and affect.  Nursing note and vitals reviewed.   ED Course  Procedures  DIAGNOSTIC STUDIES: Oxygen Saturation is 95% on RA, adequate by my interpretation.    COORDINATION OF CARE: 2:23 PM-Discussed treatment plan which includes continuation of Augmentin with pt at bedside and pt agreed to plan.     MDM   Final diagnoses:  Wound check, abscess    Patient here for wound check for abscess. Abscess is still draining, it is open, packing removed. There is no evidence of reaccumulation of fluid or cellulitis. Recommend continuing antibiotics. Primary care follow-up.  I personally performed the services described in this documentation, which was scribed in my presence. The recorded information has been reviewed and is accurate.      Roxy Horseman, PA-C 11/12/15 1445  Rolland Porter,  MD 11/22/15 2102

## 2016-06-18 ENCOUNTER — Encounter (HOSPITAL_COMMUNITY): Payer: Self-pay | Admitting: Emergency Medicine

## 2016-06-18 ENCOUNTER — Ambulatory Visit (HOSPITAL_COMMUNITY)
Admission: EM | Admit: 2016-06-18 | Discharge: 2016-06-18 | Disposition: A | Discharge status: Home / Self Care | Payer: Self-pay | Attending: Family Medicine | Admitting: Family Medicine

## 2016-06-18 DIAGNOSIS — Z202 Contact with and (suspected) exposure to infections with a predominantly sexual mode of transmission: Secondary | ICD-10-CM

## 2016-06-18 NOTE — ED Triage Notes (Signed)
Pt states that his partner was diagnosed with BV on Tuesday.  Pt is having no symptoms but would like to get checked out anyway.

## 2016-06-18 NOTE — ED Provider Notes (Signed)
[facility]    CSN: [id number] Arrival date & time: 06/18/16  1340     History   Chief Complaint Chief Complaint  Patient presents with  . Exposure to STD    HPI Bufford SpikesFrankie L Hellmann is a 35 y.o. male.   The history is provided by the patient.  Exposure to STD  This is a new problem. The current episode started more than 2 days ago (told by girl that she had BV, pt wants to get check  pt with no sx). The problem has not changed since onset.Pertinent negatives include no abdominal pain.    History reviewed. No pertinent past medical history.  There are no active problems to display for this patient.   History reviewed. No pertinent surgical history.     Home Medications    Prior to Admission medications   Medication Sig Start Date End Date Taking? Authorizing Provider  amoxicillin-clavulanate (AUGMENTIN) 875-125 MG tablet Take 1 tablet by mouth every 12 (twelve) hours. 11/11/15   Danelle BerryLeisa Tapia, PA-C  oxyCODONE-acetaminophen (PERCOCET/ROXICET) 5-325 MG tablet Take 1-2 tablets by mouth every 6 (six) hours as needed for severe pain. 11/11/15   Danelle BerryLeisa Tapia, PA-C    Family History History reviewed. No pertinent family history.  Social History Social History  Substance Use Topics  . Smoking status: Current Every Day Smoker    Packs/day: 0.25    Types: Cigars  . Smokeless tobacco: Never Used  . Alcohol use Yes     Comment: every weekend     Allergies   Patient has no known allergies.   Review of Systems Review of Systems  Constitutional: Negative.   Gastrointestinal: Negative for abdominal pain.  Genitourinary: Negative.  Negative for discharge, dysuria and genital sores.  All other systems reviewed and are negative.    Physical Exam Triage Vital Signs ED Triage Vitals [06/18/16 1407]  Enc Vitals Group     BP 129/87     Pulse Rate 77     Resp      Temp 98.1 F (36.7 C)     Temp Source Oral     SpO2 99 %     Weight      Height      Head  Circumference      Peak Flow      Pain Score      Pain Loc      Pain Edu?      Excl. in GC?    No data found.   Updated Vital Signs BP 129/87 (BP Location: Left Arm)   Pulse 77   Temp 98.1 F (36.7 C) (Oral)   SpO2 99%   Visual Acuity Right Eye Distance:   Left Eye Distance:   Bilateral Distance:    Right Eye Near:   Left Eye Near:    Bilateral Near:     Physical Exam  Abdominal: Soft. Bowel sounds are normal.  Genitourinary: Penis normal.  Musculoskeletal: Normal range of motion.  Skin: Skin is warm.  Nursing note and vitals reviewed.    UC Treatments / Results  Labs (all labs ordered are listed, but only abnormal results are displayed) Labs Reviewed  URINE CYTOLOGY ANCILLARY ONLY    EKG  EKG Interpretation None       Radiology No results found.  Procedures Procedures (including critical care time)  Medications Ordered in UC Medications - No data to display   Initial Impression / Assessment and Plan / UC Course  I have reviewed  the triage vital signs and the nursing notes.  Pertinent labs & imaging results that were available during my care of the patient were reviewed by me and considered in my medical decision making (see chart for details).  Clinical Course       Final Clinical Impressions(s) / UC Diagnoses   Final diagnoses:  None    New Prescriptions New Prescriptions   No medications on file     Linna HoffJames D Kindl, MD 07/06/16 2041

## 2016-06-18 NOTE — ED Notes (Signed)
Plan  Of  Care  Discussed  With  Patient  Phone  Number verified    And  Pt  Also  Given  Instructions  On  Activating  My  Care

## 2016-06-18 NOTE — Discharge Instructions (Signed)
We will call with positive test results and treat as indicated  °

## 2016-06-22 LAB — URINE CYTOLOGY ANCILLARY ONLY
CHLAMYDIA, DNA PROBE: NEGATIVE
NEISSERIA GONORRHEA: NEGATIVE
Trichomonas: NEGATIVE

## 2016-06-24 LAB — URINE CYTOLOGY ANCILLARY ONLY: Bacterial vaginitis: NEGATIVE

## 2023-04-08 ENCOUNTER — Encounter (HOSPITAL_COMMUNITY): Payer: Self-pay | Admitting: *Deleted

## 2023-04-08 ENCOUNTER — Other Ambulatory Visit: Payer: Self-pay

## 2023-04-08 ENCOUNTER — Ambulatory Visit (HOSPITAL_COMMUNITY)
Admission: EM | Admit: 2023-04-08 | Discharge: 2023-04-08 | Disposition: A | Discharge status: Home / Self Care | Payer: Self-pay | Attending: Emergency Medicine | Admitting: Emergency Medicine

## 2023-04-08 DIAGNOSIS — S0502XA Injury of conjunctiva and corneal abrasion without foreign body, left eye, initial encounter: Secondary | ICD-10-CM

## 2023-04-08 MED ORDER — TETRACAINE HCL 0.5 % OP SOLN
OPHTHALMIC | Status: AC
  Filled 2023-04-08: qty 4

## 2023-04-08 MED ORDER — HYDROCODONE-ACETAMINOPHEN 5-325 MG PO TABS: 1.0000 | ORAL_TABLET | Freq: Four times a day (QID) | ORAL | 0 refills | Status: AC | PRN

## 2023-04-08 MED ORDER — POLYMYXIN B-TRIMETHOPRIM 10000-0.1 UNIT/ML-% OP SOLN: 1.0000 [drp] | Freq: Four times a day (QID) | OPHTHALMIC | 0 refills | Status: AC

## 2023-04-08 NOTE — ED Triage Notes (Signed)
Pt reports LT eye pain started 3 days ago. Pt reports a scratch to eye but does not know how it happened.

## 2023-04-08 NOTE — Discharge Instructions (Signed)
Please call the eye doctor's office today to arrange a follow up appointment

## 2023-04-08 NOTE — ED Provider Notes (Signed)
[facility]    CSN: [id number] Arrival date & time: 04/08/23  1004      History   Chief Complaint Chief Complaint  Patient presents with   Eye Pain    HPI Garrett Olson is a 42 y.o. male. Pt reports his grandson scratched his L eye 2 days ago. Since then has been photophobic, has eye pain, and eye has been watering. Denies purulent eye discharge. Does not think he got something in his eye. No change in vision as long as room is dark since light bothers his eye. Has not attmpted any treatments. Pt does not wear contact lenses   Eye Pain    History reviewed. No pertinent past medical history.  There are no problems to display for this patient.   History reviewed. No pertinent surgical history.     Home Medications    Prior to Admission medications   Medication Sig Start Date End Date Taking? Authorizing Provider  HYDROcodone-acetaminophen (NORCO/VICODIN) 5-325 MG tablet Take 1 tablet by mouth every 6 (six) hours as needed. 04/08/23  Yes Cathlyn Parsons, NP  trimethoprim-polymyxin b (POLYTRIM) ophthalmic solution Place 1 drop into the left eye in the morning, at noon, in the evening, and at bedtime. 04/08/23  Yes Cathlyn Parsons, NP  amoxicillin-clavulanate (AUGMENTIN) 875-125 MG tablet Take 1 tablet by mouth every 12 (twelve) hours. 11/11/15   Danelle Berry, PA-C    Family History History reviewed. No pertinent family history.  Social History Social History   Tobacco Use   Smoking status: Every Day    Current packs/day: 0.25    Types: Cigars, Cigarettes   Smokeless tobacco: Never  Substance Use Topics   Alcohol use: Yes    Comment: every weekend   Drug use: No     Allergies   Patient has no known allergies.   Review of Systems Review of Systems  Eyes:  Positive for pain.     Physical Exam Triage Vital Signs ED Triage Vitals  Encounter Vitals Group     BP 04/08/23 1117 (!) 143/93     Systolic BP Percentile --      Diastolic BP  Percentile --      Pulse Rate 04/08/23 1117 80     Resp 04/08/23 1117 20     Temp 04/08/23 1117 98 F (36.7 C)     Temp src --      SpO2 04/08/23 1117 95 %     Weight --      Height --      Head Circumference --      Peak Flow --      Pain Score 04/08/23 1116 5     Pain Loc --      Pain Education --      Exclude from Growth Chart --    No data found.  Updated Vital Signs BP (!) 143/93   Pulse 80   Temp 98 F (36.7 C)   Resp 20   SpO2 95%   Visual Acuity Right Eye Distance:   Left Eye Distance:   Bilateral Distance:    Right Eye Near:   Left Eye Near:    Bilateral Near:     Physical Exam Constitutional:      Appearance: Normal appearance.  Eyes:     General: Lids are normal.        Left eye: No foreign body, discharge or hordeolum.     Conjunctiva/sclera:     Left eye: Left  conjunctiva is injected. No exudate or hemorrhage.    Pupils:     Left eye: Fluorescein uptake present.      Comments: R eye grossly normal.    Pulmonary:     Effort: Pulmonary effort is normal.  Neurological:     Mental Status: He is alert.      UC Treatments / Results  Labs (all labs ordered are listed, but only abnormal results are displayed) Labs Reviewed - No data to display  EKG   Radiology No results found.  Procedures Procedures (including critical care time)  Medications Ordered in UC Medications - No data to display  Initial Impression / Assessment and Plan / UC Course  I have reviewed the triage vital signs and the nursing notes.  Pertinent labs & imaging results that were available during my care of the patient were reviewed by me and considered in my medical decision making (see chart for details).    Location of uptake of fluorescein appears unusual compared with corneal abrasions I've seen in the past but given sx will treat for corneal abrasion and send to ophtho - rx pain medicine and polytrim.    Final Clinical Impressions(s) / UC Diagnoses    Final diagnoses:  Abrasion of left cornea, initial encounter     Discharge Instructions      Please call the eye doctor's office today to arrange a follow up appointment   ED Prescriptions     Medication Sig Dispense Auth. Provider   trimethoprim-polymyxin b (POLYTRIM) ophthalmic solution Place 1 drop into the left eye in the morning, at noon, in the evening, and at bedtime. 10 mL Cathlyn Parsons, NP   HYDROcodone-acetaminophen (NORCO/VICODIN) 5-325 MG tablet Take 1 tablet by mouth every 6 (six) hours as needed. 8 tablet Cathlyn Parsons, NP      I have reviewed the PDMP during this encounter.   Cathlyn Parsons, NP 04/08/23 1310
